# Patient Record
Sex: Male | Born: 1990 | Race: Black or African American | Hispanic: No | Marital: Single | Smoking: Current every day smoker
Health system: Southern US, Community
[De-identification: ages and names within clinical notes are randomized; demographics above are authoritative.]

## PROBLEM LIST (undated history)

## (undated) ENCOUNTER — Emergency Department (HOSPITAL_COMMUNITY): Payer: Self-pay | Source: Home / Self Care

## (undated) DIAGNOSIS — F419 Anxiety disorder, unspecified: Secondary | ICD-10-CM

## (undated) HISTORY — PX: HERNIA REPAIR: SHX51

---

## 2014-06-04 ENCOUNTER — Emergency Department (HOSPITAL_COMMUNITY)
Admission: EM | Admit: 2014-06-04 | Discharge: 2014-06-05 | Disposition: A | Discharge status: Home / Self Care | Payer: Self-pay | Attending: Emergency Medicine | Admitting: Emergency Medicine

## 2014-06-04 ENCOUNTER — Encounter (HOSPITAL_COMMUNITY): Payer: Self-pay | Admitting: Emergency Medicine

## 2014-06-04 ENCOUNTER — Emergency Department (HOSPITAL_COMMUNITY): Payer: Self-pay

## 2014-06-04 DIAGNOSIS — F419 Anxiety disorder, unspecified: Secondary | ICD-10-CM | POA: Insufficient documentation

## 2014-06-04 DIAGNOSIS — F1510 Other stimulant abuse, uncomplicated: Secondary | ICD-10-CM | POA: Insufficient documentation

## 2014-06-04 HISTORY — DX: Anxiety disorder, unspecified: F41.9

## 2014-06-04 LAB — CBC WITH DIFFERENTIAL/PLATELET
BASOS ABS: 0 10*3/uL (ref 0.0–0.1)
BASOS PCT: 0 % (ref 0–1)
EOS ABS: 0 10*3/uL (ref 0.0–0.7)
Eosinophils Relative: 0 % (ref 0–5)
HCT: 43.4 % (ref 39.0–52.0)
Hemoglobin: 14.9 g/dL (ref 13.0–17.0)
Lymphocytes Relative: 8 % — ABNORMAL LOW (ref 12–46)
Lymphs Abs: 0.9 10*3/uL (ref 0.7–4.0)
MCH: 28 pg (ref 26.0–34.0)
MCHC: 34.3 g/dL (ref 30.0–36.0)
MCV: 81.4 fL (ref 78.0–100.0)
MONOS PCT: 13 % — AB (ref 3–12)
Monocytes Absolute: 1.4 10*3/uL — ABNORMAL HIGH (ref 0.1–1.0)
NEUTROS PCT: 79 % — AB (ref 43–77)
Neutro Abs: 8.6 10*3/uL — ABNORMAL HIGH (ref 1.7–7.7)
Platelets: 314 10*3/uL (ref 150–400)
RBC: 5.33 MIL/uL (ref 4.22–5.81)
RDW: 13.2 % (ref 11.5–15.5)
WBC: 10.9 10*3/uL — ABNORMAL HIGH (ref 4.0–10.5)

## 2014-06-04 LAB — ACETAMINOPHEN LEVEL

## 2014-06-04 LAB — BASIC METABOLIC PANEL
ANION GAP: 12 (ref 5–15)
BUN: 17 mg/dL (ref 6–23)
CO2: 21 mmol/L (ref 19–32)
Calcium: 9.8 mg/dL (ref 8.4–10.5)
Chloride: 104 mmol/L (ref 96–112)
Creatinine, Ser: 1.21 mg/dL (ref 0.50–1.35)
GFR calc Af Amer: >90 mL/min (ref >90)
GFR, EST NON AFRICAN AMERICAN: 83 mL/min — AB (ref >90)
GLUCOSE: 137 mg/dL — AB (ref 70–99)
POTASSIUM: 3.7 mmol/L (ref 3.5–5.1)
SODIUM: 137 mmol/L (ref 135–145)

## 2014-06-04 LAB — RAPID URINE DRUG SCREEN, HOSP PERFORMED
AMPHETAMINES: POSITIVE — AB
BENZODIAZEPINES: NOT DETECTED
Barbiturates: NOT DETECTED
Cocaine: NOT DETECTED
Opiates: NOT DETECTED
TETRAHYDROCANNABINOL: NOT DETECTED

## 2014-06-04 LAB — SALICYLATE LEVEL

## 2014-06-04 LAB — D-DIMER, QUANTITATIVE: D-Dimer, Quant: 0.66 ug/mL-FEU — ABNORMAL HIGH (ref 0.00–0.48)

## 2014-06-04 LAB — TROPONIN I: Troponin I: 0.03 ng/mL (ref <0.031)

## 2014-06-04 LAB — TSH: TSH: 0.555 u[IU]/mL (ref 0.350–4.500)

## 2014-06-04 LAB — ETHANOL

## 2014-06-04 MED ORDER — LORAZEPAM 1 MG PO TABS
1.0000 mg | ORAL_TABLET | Freq: Once | ORAL | Status: AC
  Administered 2014-06-04: 1 mg via ORAL
  Filled 2014-06-04: qty 1

## 2014-06-04 MED ORDER — LORAZEPAM 2 MG/ML IJ SOLN
1.0000 mg | Freq: Once | INTRAMUSCULAR | Status: AC
  Administered 2014-06-04: 1 mg via INTRAVENOUS
  Filled 2014-06-04: qty 1

## 2014-06-04 MED ORDER — IOHEXOL 350 MG/ML SOLN: 100.0000 mL | Freq: Once | INTRAVENOUS | Status: AC | PRN

## 2014-06-04 NOTE — ED Notes (Signed)
Bed: WA02 Expected date:  Expected time:  Means of arrival:  Comments: HR 150 24 y/o M

## 2014-06-04 NOTE — ED Notes (Signed)
[facility]aphoretic and anxious. Ran and jumped into ambulance and stated that he had anxiety. HR. 150s ST. Would not tell EMS if he took drugs. Alert and oriented.

## 2014-06-04 NOTE — ED Notes (Signed)
Pt provided urinal. Nurse asks "were you able to go?" Pt responds 'yes' then hands nurse empty urinal.

## 2014-06-04 NOTE — ED Provider Notes (Signed)
CSN: [id number]     Arrival date & time 06/04/14  1943 History   First MD Initiated Contact with Patient 06/04/14 1948     Chief Complaint  Patient presents with  . Anxiety  . Tachycardia     (Consider location/radiation/quality/duration/timing/severity/associated sxs/prior Treatment) HPI Comments: Patient states he's having an anxiety attack. [facility] and anxious. He ran into an ambulance and stated he had anxiety. She appears to be under the influence of some substance but denies any drug or alcohol use. Says he used to take Xanax several years ago. He denies any chest pain or shortness of breath. Endorses some palpitations. He is alert and oriented 3. He denies any pain. He denies any suicidal or homicidal thoughts.  The history is provided by the patient.    Past Medical History  Diagnosis Date  . Anxiety    No past surgical history on file. No family history on file. History  Substance Use Topics  . Smoking status: Not on file  . Smokeless tobacco: Not on file  . Alcohol Use: Not on file    Review of Systems  Constitutional: Negative for fever, activity change and appetite change.  HENT: Negative for congestion, rhinorrhea and sinus pressure.   Respiratory: Negative for cough, chest tightness and shortness of breath.   Cardiovascular: Negative for chest pain.  Gastrointestinal: Negative for nausea, vomiting and abdominal pain.  Genitourinary: Negative for dysuria and hematuria.  Musculoskeletal: Negative for myalgias and arthralgias.  Skin: Negative for rash.  Neurological: Negative for dizziness, facial asymmetry, weakness and headaches.  Psychiatric/Behavioral: Positive for agitation. The patient is nervous/anxious and is hyperactive.   A complete 10 system review of systems was obtained and all systems are negative except as noted in the HPI and PMH.      Allergies  Review of patient's allergies indicates no known  allergies.  Home Medications   Prior to Admission medications   Not on File   BP 142/91 mmHg  Pulse 107  Temp(Src) 99.5 F (37.5 C) (Oral)  Resp 16  SpO2 96% Physical Exam  Constitutional: He is oriented to person, place, and time. He appears well-developed and well-nourished. No distress.  HENT:  Head: Normocephalic and atraumatic.  Mouth/Throat: Oropharynx is clear and moist. No oropharyngeal exudate.  Eyes: Conjunctivae and EOM are normal. Pupils are equal, round, and reactive to light.  Neck: Normal range of motion. Neck supple.  No meningismus.  Cardiovascular: Normal rate, normal heart sounds and intact distal pulses.   No murmur heard. tachycardic  Pulmonary/Chest: Effort normal and breath sounds normal. No respiratory distress. He has no wheezes.  Abdominal: Soft. There is no tenderness. There is no rebound and no guarding.  Musculoskeletal: Normal range of motion. He exhibits no edema or tenderness.  Neurological: He is alert and oriented to person, place, and time. No cranial nerve deficit. He exhibits normal muscle tone. Coordination normal.  No ataxia on finger to nose bilaterally. No pronator drift. 5/5 strength throughout. CN 2-12 intact. Negative Romberg. Equal grip strength. Sensation intact. Gait is normal.   Skin: Skin is warm. No rash noted.  Psychiatric: He has a normal mood and affect. His behavior is normal.  Nursing note and vitals reviewed.   ED Course  Procedures (including critical care time) Labs Review Labs Reviewed  CBC WITH DIFFERENTIAL/PLATELET - Abnormal; Notable for the following:    WBC 10.9 (*)    Neutrophils Relative % 79 (*)    Neutro  Abs 8.6 (*)    Lymphocytes Relative 8 (*)    Monocytes Relative 13 (*)    Monocytes Absolute 1.4 (*)    All other components within normal limits  BASIC METABOLIC PANEL - Abnormal; Notable for the following:    Glucose, Bld 137 (*)    GFR calc non Af Amer 83 (*)    All other components within normal  limits  D-DIMER, QUANTITATIVE - Abnormal; Notable for the following:    D-Dimer, Quant 0.66 (*)    All other components within normal limits  URINE RAPID DRUG SCREEN (HOSP PERFORMED) - Abnormal; Notable for the following:    Amphetamines POSITIVE (*)    All other components within normal limits  ACETAMINOPHEN LEVEL - Abnormal; Notable for the following:    Acetaminophen (Tylenol), Serum <10.0 (*)    All other components within normal limits  TROPONIN I  TSH  ETHANOL  SALICYLATE LEVEL    Imaging Review Dg Chest 2 View  06/04/2014   CLINICAL DATA:  Anxiety and tachycardia  EXAM: CHEST  2 VIEW  COMPARISON:  None.  FINDINGS: Normal heart size and mediastinal contours. No acute infiltrate or edema. No effusion or pneumothorax. No acute osseous findings.  IMPRESSION: Negative chest.   Electronically Signed   By: Marnee Spring M.D.   On: 06/04/2014 21:12   Ct Angio Chest Pe W/cm &/or Wo Cm  06/04/2014   CLINICAL DATA:  Patient diaphoretic and anxious. Patient with anxiety. Elevated heart rate.  EXAM: CT ANGIOGRAPHY CHEST WITH CONTRAST  TECHNIQUE: Multidetector CT imaging of the chest was performed using the standard protocol during bolus administration of intravenous contrast. Multiplanar CT image reconstructions and MIPs were obtained to evaluate the vascular anatomy.  CONTRAST:  100 cc Omnipaque 350  COMPARISON:  Chest radiograph earlier same date  FINDINGS: Examination markedly limited secondary to motion artifact.  No evidence for central or lobar pulmonary embolism. Evaluation of the more distal pulmonary arteries to include the segmental subsegmental levels nondiagnostic due to extensive motion artifact.  Mediastinum/Nodes: No enlarged axillary, mediastinal or hilar lymphadenopathy. Normal heart size. No pericardial effusion. Aorta and main pulmonary artery are normal in caliber.  Lungs/Pleura: Central airways are patent. Motion artifact limits evaluation of the pulmonary parenchyma. No large  consolidative pulmonary opacities. No pleural effusion or pneumothorax.  Upper abdomen: Visualization of the upper abdomen limited due to motion artifact. No gross abnormality identified.  Musculoskeletal: No aggressive or acute appearing osseous lesions.  Review of the MIP images confirms the above findings.  IMPRESSION: Examination markedly limited due to motion artifact.  No evidence for central, main or lobar pulmonary embolism. Evaluation of the segmental subsegmental pulmonary arteries nondiagnostic due to motion artifact.   Electronically Signed   By: Annia Belt M.D.   On: 06/04/2014 21:43     EKG Interpretation   Date/Time:  Monday June 04 2014 19:54:14 EDT Ventricular Rate:  124 PR Interval:  154 QRS Duration: 81 QT Interval:  315 QTC Calculation: 452 R Axis:   -75 Text Interpretation:  Sinus tachycardia Left axis deviation ST elev,  probable normal early repol pattern No previous ECGs available Confirmed  by Manus Gunning  MD, Zilda No [phone]) on 06/04/2014 8:00:44 PM      MDM   Final diagnoses:  None   patient presents with anxiety, bizarre behavior, diaphoresis. Denies chest pain or shortness of breath. He is tachycardic and appears anxious. Denies suicidal or homicidal thoughts.  Patient gives given by mouth Ativan. States he used  to be on Xanax but hasn't taken it in several years.  Chest x-ray is negative. Sinus tachycardia on EKG. Urinalysis with amphetamines.  Drug screen positive for amphetamines. No evidence of PE. Heart rate is improved to the 100s.  Patient now somnolent after receiving Ativan. He arouses to pain and voice and protecting his airway.  He is not awake enough for discharge at this time. Will need further monitoring of mental status.  BP 142/91 mmHg  Pulse 107  Temp(Src) 99.5 F (37.5 C) (Oral)  Resp 16  SpO2 96%      Glynn Octave, MD 06/05/14 [phone]

## 2014-06-04 NOTE — ED Notes (Signed)
Pt acting very paranoid. Keeps coming out of room and looking around. Asked to sit back in room to which he complied.

## 2014-06-05 NOTE — ED Notes (Signed)
Patient able to ambulate with steady gait. Able to tolerate fluids with no N/V.

## 2014-06-05 NOTE — ED Provider Notes (Signed)
Patient was left to change his shift after he presented with anxiety attack. He was given Ativan after which she was extremely somnolent. Patient is now awake and alert. He states he's feeling much improved. He is ambulatory in the emergency department without assistance.  Plan discharge   Devoria AlbeIva Landi Biscardi, MD, [facility]oFACEP   Jaslynn Thome, MD 06/05/14 [phone]

## 2014-06-05 NOTE — Discharge Instructions (Signed)
Chemical Dependency Stop using drugs. Follow up with your doctor. Return to the ED if you develop new or worsening symptoms. Chemical dependency is an addiction to drugs or alcohol. It is characterized by the repeated behavior of seeking out and using drugs and alcohol despite harmful consequences to the health and safety of ones self and others.  RISK FACTORS There are certain situations or behaviors that increase a person's risk for chemical dependency. These include:  A family history of chemical dependency.  A history of mental health issues, including depression and anxiety.  A home environment where drugs and alcohol are easily available to you.  Drug or alcohol use at a young age. SYMPTOMS  The following symptoms can indicate chemical dependency:  Inability to limit the use of drugs or alcohol.  Nausea, sweating, shakiness, and anxiety that occurs when alcohol or drugs are not being used.  An increase in amount of drugs or alcohol that is necessary to get drunk or high. People who experience these symptoms can assess their use of drugs and alcohol by asking themselves the following questions:  Have you been told by friends or family that they are worried about your use of alcohol or drugs?  Do friends and family ever tell you about things you did while drinking alcohol or using drugs that you do not remember?  Do you lie about using alcohol or drugs or about the amounts you use?  Do you have difficulty completing daily tasks unless you use alcohol or drugs?  Is the level of your work or school performance lower because of your drug or alcohol use?  Do you get sick from using drugs or alcohol but keep using anyway?  Do you feel uncomfortable in social situations unless you use alcohol or drugs?  Do you use drugs or alcohol to help forget problems? An answer of yes to any of these questions may indicate chemical dependency. Professional evaluation is suggested. Document  Released: 02/10/2001 Document Revised: 05/11/2011 Document Reviewed: 04/24/2010 [facility] Patient Information 2015 [place], [place]. This information is not intended to replace advice given to you by your health care provider. Make sure you discuss any questions you have with your health care provider.   Emergency Department Resource Guide 1) Find a Doctor and Pay Out of Pocket Although you won't have to find out who is covered by your insurance plan, it is a good idea to ask around and get recommendations. You will then need to call the office and see if the doctor you have chosen will accept you as a new patient and what types of options they offer for patients who are self-pay. Some doctors offer discounts or will set up payment plans for their patients who do not have insurance, but you will need to ask so you aren't surprised when you get to your appointment.  2) Contact Your Local Health Department Not all health departments have doctors that can see patients for sick visits, but many do, so it is worth a call to see if yours does. If you don't know where your local health department is, you can check in your phone book. The CDC also has a tool to help you locate your state's health department, and many state websites also have listings of all of their local health departments.  3) Find a Walk-in Clinic If your illness is not likely to be very severe or complicated, you may want to try a walk in clinic. These are popping up all over  the country in pharmacies, drugstores, and shopping centers. They're usually staffed by nurse practitioners or physician assistants that have been trained to treat common illnesses and complaints. They're usually fairly quick and inexpensive. However, if you have serious medical issues or chronic medical problems, these are probably not your best option.  No Primary Care Doctor: - Call Health Connect at  [phone] - they can help you locate a primary care doctor that   accepts your insurance, provides certain services, etc. - Physician Referral Service- [phone]  Chronic Pain Problems: Organization         Address  Phone   Notes  [facility]  [phone]) [phone] Patients need to be referred by their primary care doctor.   Medication Assistance: Organization         Address  Phone   Notes  [facility] [address] [place], [place] [address] [phone]) [phone] --Must be a resident of [place] [facility] [place] -- Must have NO insurance coverage whatsoever (no Medicaid/ Medicare, etc.) -- The pt. MUST have a primary care doctor that directs their care regularly and follows them in the community   MedAssist  [phone]   [employer]  [phone]    Agencies that provide inexpensive medical care: Organization         Address  Phone   Notes  [facility]  [phone](336) [phone]   [facility]    [phone](336) [phone]   [facility] [address] [place], [place] [address] [phone]) [phone]   [facility] [address], [place] [phone]) [phone]   [facility]anned Parenthood    [phone]) [phone]   [facility]    [phone]) [phone]   [facility] Health and [facility]  [address], [place] Phone:  [phone], Fax:  [phone] Hours of Operation:  9 am - 6 pm, M-F.  Also accepts Medicaid/Medicare and self-pay.  [facility]  [address], [place] Phone: [phone], Fax: [phone]. Hours of Operation:  8:30 am - 5:30 pm, M-F.  Also accepts Medicaid and self-pay.  [facility] [place] [address], [place] Phone: [phone]) [phone]   [facility] [address], [place], [place] [phone], Ext. [phone] Mondays & Thursdays: 7-9 AM.  First 15 patients are seen on a first come, first serve basis.    Medicaid-accepting [facility] Providers:  Organization          Address  Phone   Notes  [facility] [address], [place] [phone]) [phone] Also accepts self-pay patients.  [facility] [address] [place], [place]  [phone]) [phone]   [facility] [address], [place] [phone](336) [phone]   [facility] [address] [place] [address], [place] [phone]) [phone]   Renaye RakersVeita Bland [address], [place]   [phone] Only accepts [place] Access [place] patients after they have their name applied to their card.   Self-Pay (no insurance) in [facility] [place]:  Organization         Address  Phone   Notes  Sickle Cell Patients, [facility] [address], [place] [phone]   [facility] [address], [place] [phone]) [phone]   [facility] [place]  [address], [place] [phone]   °[facility]/Dr. Osei-Bonsu ° [address] [place] [address], [place] or [address], [place] [phone] Phone number for both [place] and [place] locations is the same.  °[facility] [address], [place] [phone]   °[facility] [address] [place] [address], [place] or [address] [phone] °[phone]   °[facility] [address], [place] [phone], phone; [phone], fax Sees patients 1st and 3rd Saturday of every month.  Must not qualify for public or private insurance (i.e. Medicaid, Medicare, [place] Health Choice, Veterans' Benefits) • Household income should be no more than 200% of the poverty level •The clinic cannot treat you if you are pregnant or think you are pregnant • Sexually transmitted diseases are not treated at the clinic.  ° ° °Dental Care: °Organization         Address  Phone  Notes  °[place] [address],  [place] [phone] Accepts children up to age 21 who are enrolled in Medicaid or [place] Health Choice; pregnant women with a Medicaid card; and children who have applied for Medicaid or [place] Health Choice, but were declined, whose parents can pay a reduced fee at time of service.  °[place] [place]  [address], [place] [phone] Accepts children up to age 21 who are enrolled in Medicaid or [place] Health Choice; pregnant women with a Medicaid card; and children who have applied for Medicaid or [place] Health Choice, but were declined, whose parents can pay a reduced fee at time of service.  °[facility] ° [address], [place] [phone] Patients are seen by appointment only. Walk-ins are not accepted. [facility] will see patients 18 years of age and older. °Monday - Tuesday (8am-5pm) °Most Wednesdays (8:30-5pm) °$30 per visit, cash only  °[facility] ° [address], [place] [phone] Patients are seen by appointment only. Walk-ins are not accepted. [facility] will see patients 18 years of age and older. °One Wednesday Evening (Monthly: Volunteer Based).  $30 per visit, cash only  °[facility]  [phone] for adults; Children under age 4, call Graduate Pediatric Dentistry at [phone]. Children aged 4-14, please call [phone] to request a pediatric application. ° Dental services are provided in all areas of dental care including fillings, crowns and bridges, complete and partial dentures, implants, gum treatment, root canals, and extractions. Preventive care is also provided. Treatment is provided to both adults and children. °Patients are selected via a lottery and there is often a waiting list. °  °[facility] [address], °[place] ° [phone] [email or web address] °  °[facility] [address], [place], [place]  [phone], Ext. [phone] Second and Fourth Thursday of each month, opens at 6:30 AM; Clinic ends at 9 AM.  Patients are seen on a first-come first-served basis, and a limited number are seen during each clinic.  ° °[facility] ° [address], [place], [place] [phone]   Eligibility Requirements °You must have lived in [place], [place], or [place] counties for at least the last three months. °  You cannot be eligible for state or federal sponsored healthcare insurance, including Veterans Administration, Medicaid, or Medicare. °  You   You generally cannot be eligible for healthcare insurance through your employer.    How to apply: Eligibility screenings are held every Tuesday and Wednesday afternoon from 1:00 pm until 4:00 pm. You do not need an appointment for the interview!  [facility] [address], [place], [place]   [place]  [place] Department  [place]  [phone]    Behavioral Health Resources in the Community: Intensive Outpatient Programs Organization         Address  Phone  Notes  [facility] Potter. [address] [place], [place] [phone]   [facility] [address] [place], [place]   ADS: [facility] [address] [place], [place]   [place] [address]  [place], [place] or [phone]   Substance Abuse Resources Organization         Address  Phone  Notes  [facility]  [phone]   [place]  [phone]   [facility] [place]   Chinita Pester  [phone])[phone]   Residential & Outpatient Substance Abuse Program  [phone]   Psychological Services Organization         Address  Phone  Notes  [facility]  [place]  [phone]    [place] [address], [place] or [phone]    Mobile Crisis Teams Organization         Address  Phone  Notes  [facility], Mobile Crisis Care Unit  [phone]   [facility]  [address] [place], [place]   [place] Levels [address], [place] [phone]    Self-Help/Support Groups Organization         Address  Phone             Notes  [place] [place] - variety of support groups  [place] Call for more information  Narcotics Anonymous (NA), [facility] [address], [employer] [place]  2 meetings at this location   Special educational needs teacher         Address  Phone  Notes  [facility] [place],    [place]  [phone]   [facility]  [address], [place] [address]19, [place], [place]   [place], [place] [phone] Admissions: 8am-3pm M-F  Incentives Substance [place] [address],    [place], [place] [phone]   [facility] [address] [place], [place], [place]   [facility] [address],  [place], [place]   [facility] - Intensive Outpatient [address], [place], [place]   [facility].,  [place], [place] [phone] or [phone]   [facility] (RTS) [address], [place], [place] Accepts Medicaid  [facility] [place] [address],  [place] [phone] Substance Abuse/Addiction Treatment   [facility] Organization         Address  Phone  Notes  [facility]  [phone]   Domenic Schwab, PhD [address] [place], [place]   [phone] or [phone]   [facility]   [phone]  [address] [place], [place] [phone](336) [phone]   [facility] [address], [place], [place] [phone]) [phone] Insurance/Medicaid/sponsorship through [facility]  [facility] [address]                                    [place], [place] [phone]) [phone] Therapy/tele-psych/case  [facility] [address]   [place], [place] [phone](336) [phone]    Dr. Lolly MustacheArfeen  [phone](336) [phone]   [facility]  [employer] [facility] 1) [address], [place] 2) [address], [place] 3)  [address], [place] [phone]) [phone]) [phone]  [phone]) [phone]   [facility] Child Abuse Hotline [phone](336) [phone] or [phone]) [phone] (After Hours)

## 2014-06-09 DIAGNOSIS — Z720 Tobacco use: Secondary | ICD-10-CM | POA: Insufficient documentation

## 2014-06-09 DIAGNOSIS — Z8659 Personal history of other mental and behavioral disorders: Secondary | ICD-10-CM | POA: Insufficient documentation

## 2014-06-09 DIAGNOSIS — L02413 Cutaneous abscess of right upper limb: Secondary | ICD-10-CM | POA: Insufficient documentation

## 2014-06-10 ENCOUNTER — Emergency Department (HOSPITAL_COMMUNITY)
Admission: EM | Admit: 2014-06-10 | Discharge: 2014-06-10 | Disposition: A | Discharge status: Home / Self Care | Payer: Self-pay | Attending: Emergency Medicine | Admitting: Emergency Medicine

## 2014-06-10 ENCOUNTER — Encounter (HOSPITAL_COMMUNITY): Payer: Self-pay

## 2014-06-10 DIAGNOSIS — L02413 Cutaneous abscess of right upper limb: Secondary | ICD-10-CM

## 2014-06-10 LAB — CBC WITH DIFFERENTIAL/PLATELET
Basophils Absolute: 0.1 10*3/uL (ref 0.0–0.1)
Basophils Relative: 1 % (ref 0–1)
Eosinophils Absolute: 0.1 10*3/uL (ref 0.0–0.7)
Eosinophils Relative: 2 % (ref 0–5)
HCT: 42.6 % (ref 39.0–52.0)
Hemoglobin: 14.2 g/dL (ref 13.0–17.0)
LYMPHS ABS: 1.5 10*3/uL (ref 0.7–4.0)
Lymphocytes Relative: 27 % (ref 12–46)
MCH: 27.4 pg (ref 26.0–34.0)
MCHC: 33.3 g/dL (ref 30.0–36.0)
MCV: 82.2 fL (ref 78.0–100.0)
Monocytes Absolute: 1 10*3/uL (ref 0.1–1.0)
Monocytes Relative: 18 % — ABNORMAL HIGH (ref 3–12)
Neutro Abs: 2.8 10*3/uL (ref 1.7–7.7)
Neutrophils Relative %: 52 % (ref 43–77)
PLATELETS: 350 10*3/uL (ref 150–400)
RBC: 5.18 MIL/uL (ref 4.22–5.81)
RDW: 13 % (ref 11.5–15.5)
WBC: 5.4 10*3/uL (ref 4.0–10.5)

## 2014-06-10 LAB — BASIC METABOLIC PANEL
ANION GAP: 13 (ref 5–15)
BUN: 11 mg/dL (ref 6–23)
CO2: 26 mmol/L (ref 19–32)
Calcium: 9.7 mg/dL (ref 8.4–10.5)
Chloride: 101 mmol/L (ref 96–112)
Creatinine, Ser: 1.01 mg/dL (ref 0.50–1.35)
Glucose, Bld: 96 mg/dL (ref 70–99)
POTASSIUM: 3.6 mmol/L (ref 3.5–5.1)
Sodium: 140 mmol/L (ref 135–145)

## 2014-06-10 MED ORDER — CLINDAMYCIN HCL 150 MG PO CAPS: 300.0000 mg | ORAL_CAPSULE | Freq: Three times a day (TID) | ORAL | Status: AC

## 2014-06-10 MED ORDER — CLINDAMYCIN PHOSPHATE 900 MG/50ML IV SOLN
900.0000 mg | Freq: Once | INTRAVENOUS | Status: AC
  Administered 2014-06-10: 900 mg via INTRAVENOUS
  Filled 2014-06-10: qty 50

## 2014-06-10 MED ORDER — HYDROGEN PEROXIDE 3 % EX SOLN
CUTANEOUS | Status: AC
  Filled 2014-06-10: qty 473

## 2014-06-10 MED ORDER — IBUPROFEN 800 MG PO TABS: 800.0000 mg | ORAL_TABLET | Freq: Three times a day (TID) | ORAL | Status: AC

## 2014-06-10 MED ORDER — LIDOCAINE HCL (PF) 1 % IJ SOLN
5.0000 mL | Freq: Once | INTRAMUSCULAR | Status: AC
  Administered 2014-06-10: 5 mL
  Filled 2014-06-10: qty 5

## 2014-06-10 MED ORDER — OXYCODONE-ACETAMINOPHEN 5-325 MG PO TABS: 1.0000 | ORAL_TABLET | ORAL | Status: AC | PRN

## 2014-06-10 MED ORDER — OXYCODONE-ACETAMINOPHEN 5-325 MG PO TABS
1.0000 | ORAL_TABLET | Freq: Once | ORAL | Status: AC
  Administered 2014-06-10: 1 via ORAL
  Filled 2014-06-10: qty 1

## 2014-06-10 NOTE — ED Provider Notes (Signed)
CSN: [id number]     Arrival date & time 06/09/14  2356 History   First MD Initiated Contact with Patient 06/10/14 0013     Chief Complaint  Patient presents with  . Abscess     (Consider location/radiation/quality/duration/timing/severity/associated sxs/prior Treatment) Patient is a 24 y.o. male presenting with abscess. The history is provided by the patient. No language interpreter was used.  Abscess Location:  Shoulder/arm Shoulder/arm abscess location:  R elbow Abscess quality: painful and redness   Abscess quality: not draining   Duration:  3 days Associated symptoms: no fever, no nausea and no vomiting   Associated symptoms comment:  Large abscess in the right arm, worsening over 3 days. No fever but he reports hot/cold chills. No vomiting. The patient denies IV drug abuse.    Past Medical History  Diagnosis Date  . Anxiety    History reviewed. No pertinent past surgical history. History reviewed. No pertinent family history. History  Substance Use Topics  . Smoking status: Current Every Day Smoker -- 0.50 packs/day    Types: Cigarettes  . Smokeless tobacco: Not on file  . Alcohol Use: Yes     Comment: social    Review of Systems  Constitutional: Positive for chills. Negative for fever.  Gastrointestinal: Negative.  Negative for nausea and vomiting.  Musculoskeletal: Negative.  Negative for myalgias.  Skin: Positive for color change.       Large abscess right arm  Neurological: Negative.       Allergies  Review of patient's allergies indicates no known allergies.  Home Medications   Prior to Admission medications   Medication Sig Start Date End Date Taking? Authorizing Provider  acetaminophen (TYLENOL) 325 MG tablet Take 650 mg by mouth every 6 (six) hours as needed for moderate pain.   Yes Historical Provider, MD  tetrahydrozoline 0.05 % ophthalmic solution Place 1 drop into both eyes 2 (two) times daily as needed (dry eyes).   Yes Historical Provider, MD    BP 139/87 mmHg  Pulse 100  Temp(Src) 98.6 F (37 C) (Oral)  Resp 20  Ht 6\' 4"  (1.93 m)  Wt 200 lb (90.719 kg)  BMI 24.35 kg/m2  SpO2 100% Physical Exam  Constitutional: He is oriented to person, place, and time. He appears well-developed and well-nourished. No distress.  Musculoskeletal: Normal range of motion. He exhibits tenderness.  Neurological: He is alert and oriented to person, place, and time.  Skin:  Large area of swelling with fluctuant center to volar distal upper arm just above AC space. No active drainage. There is erythema extending proximally without streaking. No evidence of multiple injection sites or 'track marks'.      ED Course  Procedures (including critical care time) Labs Review Labs Reviewed  CBC WITH DIFFERENTIAL/PLATELET - Abnormal; Notable for the following:    Monocytes Relative 18 (*)    All other components within normal limits  BASIC METABOLIC PANEL    Imaging Review No results found.   EKG Interpretation None     INCISION AND DRAINAGE Performed by: Elpidio AnisUPSTILL, Ezekial Arns A Consent: Verbal consent obtained. Risks and benefits: risks, benefits and alternatives were discussed Type: abscess  Body area: right upper arm  Anesthesia: local infiltration  Incision was made with a scalpel.  Local anesthetic: lidocaine 2% w/o epinephrine  Anesthetic total: 2 ml  Complexity: complex Blunt dissection to break up loculations  Drainage: purulent  Drainage amount: large, purulent  Packing material: 1/4 in iodoform gauze  Patient tolerance: Patient tolerated the  procedure well with no immediate complications.    MDM   Final diagnoses:  None    1. Cutaneous abscess  Patient given IV clindamycin given large area of cellulitis surrounding the abscess. VSS. Pain medication provided. Instruction provided for care and 2-day recheck in ED.     Elpidio Anis, PA-C 06/10/14 1027  Devoria Albe, MD 06/10/14 [phone]

## 2014-06-10 NOTE — Discharge Instructions (Signed)

## 2014-06-10 NOTE — ED Notes (Signed)
Patient reports he began having pain on right arm three days ago and area has gradually become more inflamed and painful.

## 2014-06-15 ENCOUNTER — Encounter (HOSPITAL_COMMUNITY): Payer: Self-pay | Admitting: Emergency Medicine

## 2014-06-15 ENCOUNTER — Emergency Department (HOSPITAL_COMMUNITY)
Admission: EM | Admit: 2014-06-15 | Discharge: 2014-06-15 | Disposition: A | Discharge status: Home / Self Care | Payer: Self-pay | Attending: Emergency Medicine | Admitting: Emergency Medicine

## 2014-06-15 DIAGNOSIS — Z8659 Personal history of other mental and behavioral disorders: Secondary | ICD-10-CM | POA: Insufficient documentation

## 2014-06-15 DIAGNOSIS — R197 Diarrhea, unspecified: Secondary | ICD-10-CM | POA: Insufficient documentation

## 2014-06-15 DIAGNOSIS — Z4801 Encounter for change or removal of surgical wound dressing: Secondary | ICD-10-CM | POA: Insufficient documentation

## 2014-06-15 DIAGNOSIS — Z792 Long term (current) use of antibiotics: Secondary | ICD-10-CM | POA: Insufficient documentation

## 2014-06-15 DIAGNOSIS — Z5189 Encounter for other specified aftercare: Secondary | ICD-10-CM

## 2014-06-15 DIAGNOSIS — Z720 Tobacco use: Secondary | ICD-10-CM | POA: Insufficient documentation

## 2014-06-15 NOTE — Discharge Instructions (Signed)
Return if your diarrhea worsens  Scar Minimization You will have a scar anytime you have surgery and a cut is made in the skin or you have something removed from your skin (mole, skin cancer, cyst). Although scars are unavoidable following surgery, there are ways to minimize their appearance. It is important to follow all the instructions you receive from your caregiver about wound care. How your wound heals will influence the appearance of your scar. If you do not follow the wound care instructions as directed, complications such as infection may occur. Wound instructions include keeping the wound clean, moist, and not letting the wound form a scab. Some people form scars that are raised and lumpy (hypertrophic) or larger than the initial wound (keloidal). HOME CARE INSTRUCTIONS   Follow wound care instructions as directed.  Keep the wound clean by washing it with soap and water.  Keep the wound moist with provided antibiotic cream or petroleum jelly until completely healed. Moisten twice a day for about 2 weeks.  Get stitches (sutures) taken out at the scheduled time.  Avoid touching or manipulating your wound unless needed. Wash your hands thoroughly before and after touching your wound.  Follow all restrictions such as limits on exercise or work. This depends on where your scar is located.  Keep the scar protected from sunburn. Cover the scar with sunscreen/sunblock with SPF 30 or higher.  Gently massage the scar using a circular motion to help minimize the appearance of the scar. Do this only after the wound has closed and all the sutures have been removed.  For hypertrophic or keloidal scars, there are several ways to treat and minimize their appearance. Methods include compression therapy, intralesional corticosteroids, laser therapy, or surgery. These methods are performed by your caregiver. Remember that the scar may appear lighter or darker than your normal skin color. This  difference in color should even out with time. SEEK MEDICAL CARE IF:   You have a fever.  You develop signs of infection such as pain, redness, pus, and warmth.  You have questions or concerns. Document Released: 08/06/2009 Document Revised: 05/11/2011 Document Reviewed: 08/06/2009 [facility] Patient Information 2015 [place], [place]. This information is not intended to replace advice given to you by your health care provider. Make sure you discuss any questions you have with your health care provider.  Wound Check Your wound appears healthy today. Your wound will heal gradually over time. Eventually a scar will form that will fade with time. FACTORS THAT AFFECT SCAR FORMATION:  People differ in the severity in which they scar.  Scar severity varies according to location, size, and the traits you inherited from your parents (genetic predisposition).  Irritation to the wound from infection, rubbing, or chemical exposure will increase the amount of scar formation. HOME CARE INSTRUCTIONS   If you were given a dressing, you should change it at least once a day or as instructed by your caregiver. If the bandage sticks, soak it off with a solution of hydrogen peroxide.  If the bandage becomes wet, dirty, or develops a bad smell, change it as soon as possible.  Look for signs of infection.  Only take over-the-counter or prescription medicines for pain, discomfort, or fever as directed by your caregiver. SEEK IMMEDIATE MEDICAL CARE IF:   You have redness, swelling, or increasing pain in the wound.  You notice pus coming from the wound.  You have a fever.  You notice a bad smell coming from the wound or dressing. Document Released:  11/23/2003 Document Revised: 05/11/2011 Document Reviewed: 02/16/2005 [employer] Patient Information 2015 [employer]. This information is not intended to replace advice given to you by your health care provider. Make sure you discuss any questions you have  with your health care provider.

## 2014-06-15 NOTE — ED Notes (Signed)
Pt requesting  Wound follow up/recheck of I and D site in r/anticubital. C/o tenderness at site. No swelling or redness noted. Pt is noncompliant with discharge instructions. Pt stated that he did not fill any of his prescriptions for financial reasons

## 2014-06-15 NOTE — ED Provider Notes (Signed)
CSN: [id number]     Arrival date & time 06/15/14  1128 History   First MD Initiated Contact with Patient 06/15/14 1139     Chief Complaint  Patient presents with  . Wound Check    post I and D of abscess in r/arm     (Consider location/radiation/quality/duration/timing/severity/associated sxs/prior Treatment) HPI  This is a 24 year old male who presents to the emergency department for wound check.  Patient had incision and drainage performed on 06/10/2014. Patient had a dose of IV clindamycin. However, he did not fill his oral clindamycin due to cost. The patient states that he has seen significant improvement in the wound. He denies any heat, redness, drainage. There is minimal tenderness to palpation. Patient also states that he has had several episodes of watery diarrhea after receiving IV clindamycin. He denies excessive or voluminous stools, severe nausea, and severe foul odor of stools. Past Medical History  Diagnosis Date  . Anxiety    Past Surgical History  Procedure Laterality Date  . Hernia repair     Family History  Problem Relation Age of Onset  . Diabetes Mother    History  Substance Use Topics  . Smoking status: Current Every Day Smoker -- 0.50 packs/day    Types: Cigarettes  . Smokeless tobacco: Not on file  . Alcohol Use: Yes     Comment: social    Review of Systems  Ten systems reviewed and are negative for acute change, except as noted in the HPI.    Allergies  Review of patient's allergies indicates no known allergies.  Home Medications   Prior to Admission medications   Medication Sig Start Date End Date Taking? Authorizing Provider  acetaminophen (TYLENOL) 325 MG tablet Take 650 mg by mouth every 6 (six) hours as needed for moderate pain.    Historical Provider, MD  clindamycin (CLEOCIN) 150 MG capsule Take 2 capsules (300 mg total) by mouth 3 (three) times daily. 06/10/14   Elpidio Anis, PA-C  ibuprofen (ADVIL,MOTRIN) 800 MG tablet Take 1 tablet  (800 mg total) by mouth 3 (three) times daily. 06/10/14   Elpidio Anis, PA-C  oxyCODONE-acetaminophen (PERCOCET/ROXICET) 5-325 MG per tablet Take 1-2 tablets by mouth every 4 (four) hours as needed for severe pain. 06/10/14   Elpidio Anis, PA-C  tetrahydrozoline 0.05 % ophthalmic solution Place 1 drop into both eyes 2 (two) times daily as needed (dry eyes).    Historical Provider, MD   BP 117/69 mmHg  Pulse 73  Temp(Src) 98.6 F (37 C) (Oral)  Resp 18  SpO2 98% Physical Exam  Constitutional: He appears well-developed and well-nourished. No distress.  HENT:  Head: Normocephalic and atraumatic.  Eyes: Conjunctivae are normal. No scleral icterus.  Neck: Normal range of motion. Neck supple.  Cardiovascular: Normal rate, regular rhythm and normal heart sounds.   Pulmonary/Chest: Effort normal and breath sounds normal. No respiratory distress.  Abdominal: Soft. There is no tenderness.  Musculoskeletal: He exhibits no edema.  Well healing wound on the right antecubital space, no discharge, no induration, no surrounding redness heat or swelling.  Neurological: He is alert.  Skin: Skin is warm and dry. He is not diaphoretic.  Psychiatric: His behavior is normal.  Nursing note and vitals reviewed.   ED Course  Procedures (including critical care time) Labs Review Labs Reviewed - No data to display  Imaging Review No results found.   EKG Interpretation None      MDM   Final diagnoses:  Wound check, abscess  Diarrhea    Wound appears to be healing well. Vision is unable to provide a stool sample at this time. I've asked that he return if his symptoms do not resolve or worsen.    Arthor Captainbigail Kip Kautzman, PA-C 06/15/14 1448  Glynn OctaveStephen Rancour, MD 06/15/14 [id number]

## 2015-06-01 DEATH — deceased

## 2017-02-20 IMAGING — CT CT ANGIO CHEST
1 of 3 series · 18 of 33 positions shown · IV contrast (OMNIPAQUE 350)
Comparison: Chest radiograph earlier same date

CLINICAL DATA: Patient diaphoretic and anxious. Patient with
anxiety. Elevated heart rate.

EXAM:
CT ANGIOGRAPHY CHEST WITH CONTRAST
TECHNIQUE: Multidetector CT imaging of the chest was performed using the
standard protocol during bolus administration of intravenous
contrast. Multiplanar CT image reconstructions and MIPs were
obtained to evaluate the vascular anatomy.
CONTRAST:  100 cc Omnipaque 350

[Series 6: thins for pacs · axial · 0.74mm/px · z∈[-242,+1]mm · 18 of 279 slices shown]
[im 18/279  lung]
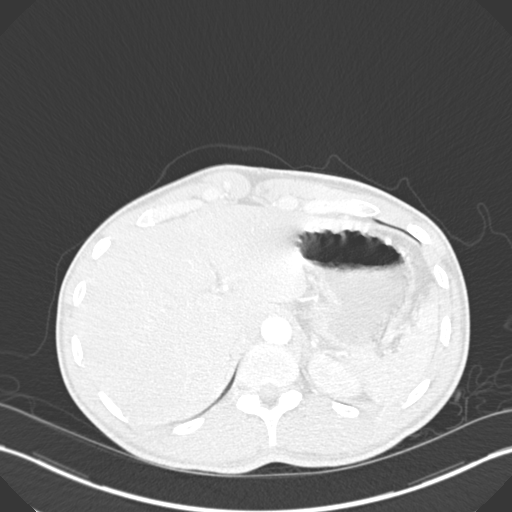
[im 35/279  mediastinal]
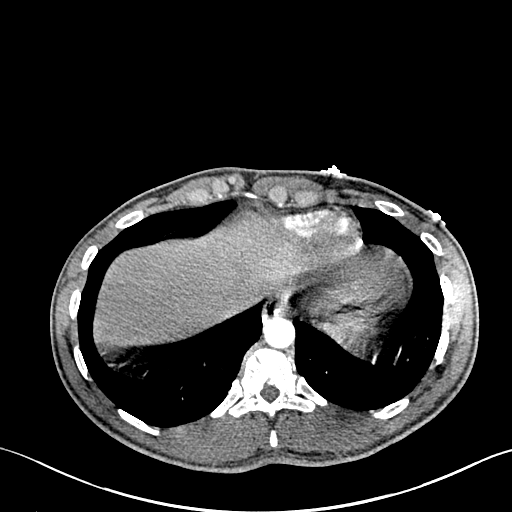
[im 53/279  lung]
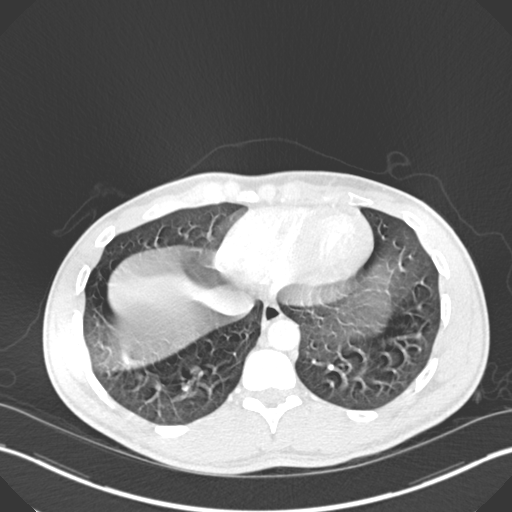
[im 70/279  mediastinal]
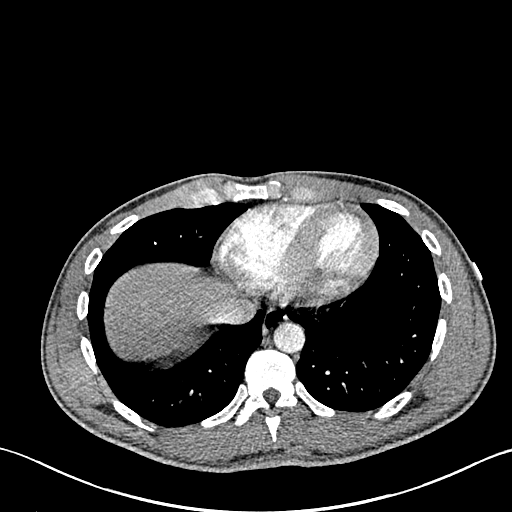
[im 87/279  lung]
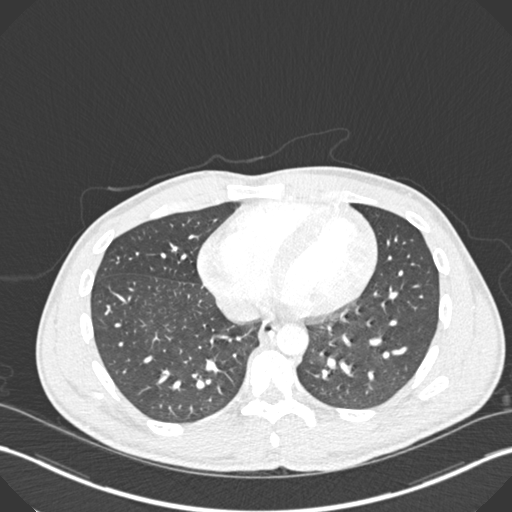
[im 93/279  mediastinal]
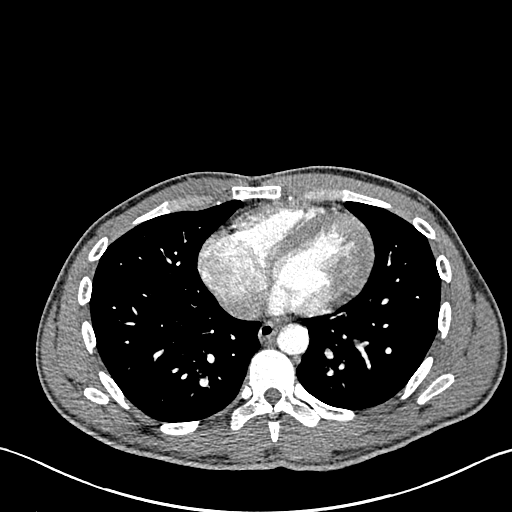
[im 105/279  lung]
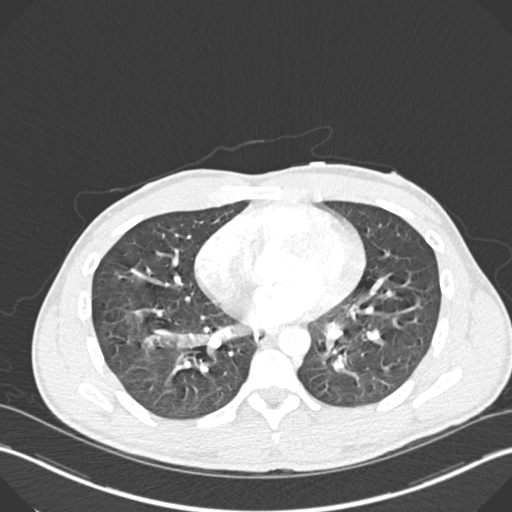
[im 122/279  mediastinal]
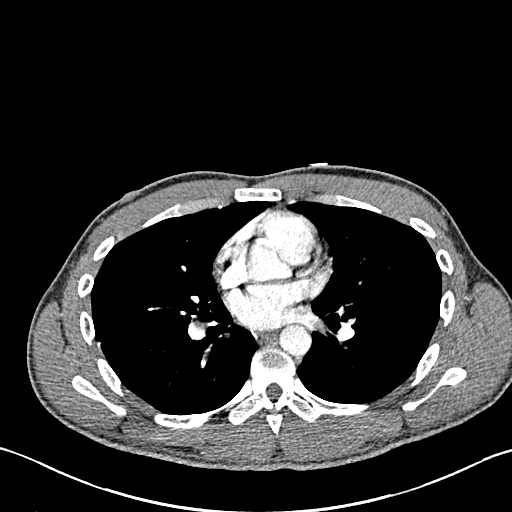
[im 130/279  lung]
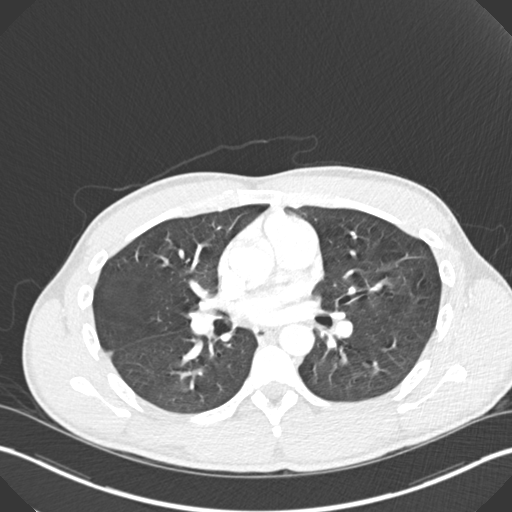
[im 140/279  mediastinal]
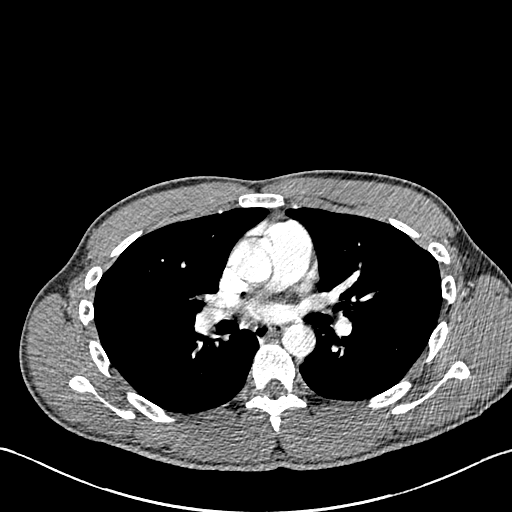
[im 157/279  lung]
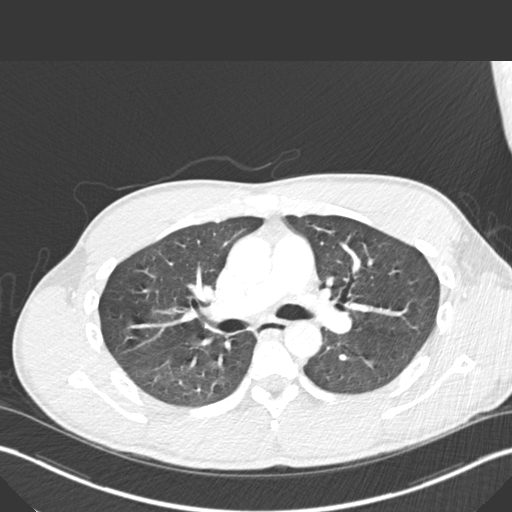
[im 174/279  mediastinal]
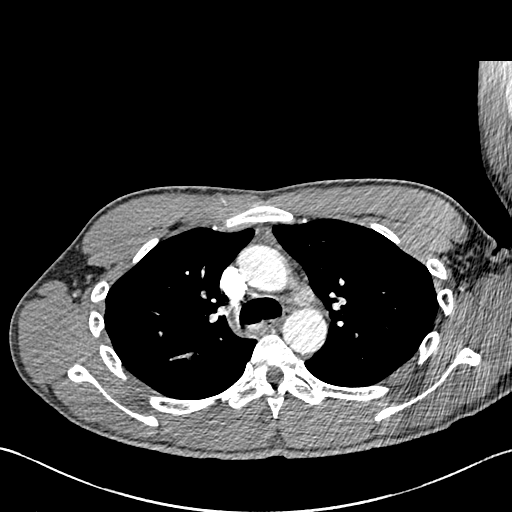
[im 186/279  lung]
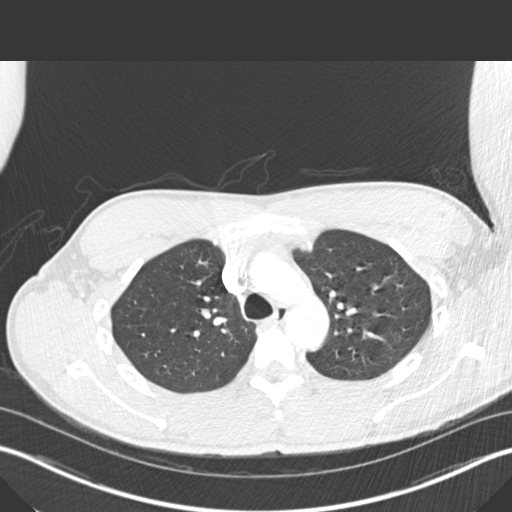
[im 192/279  mediastinal]
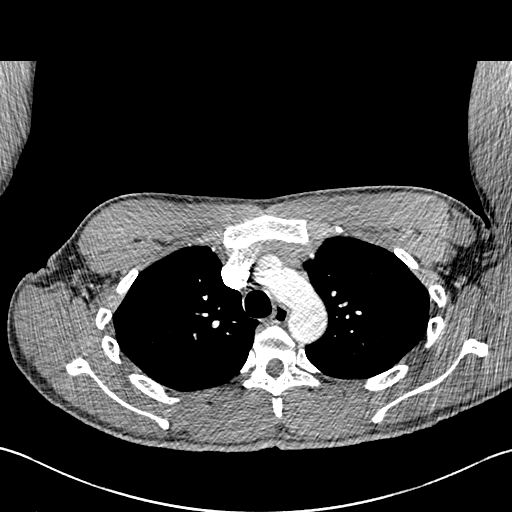
[im 209/279  lung]
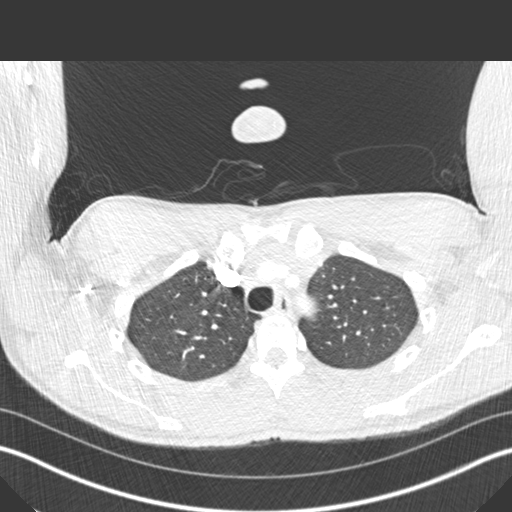
[im 226/279  mediastinal]
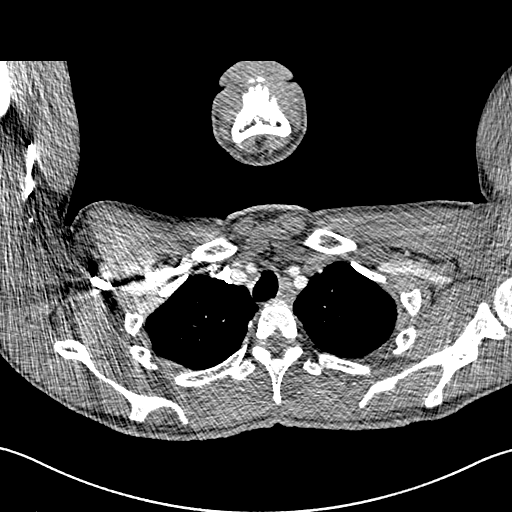
[im 244/279  lung]
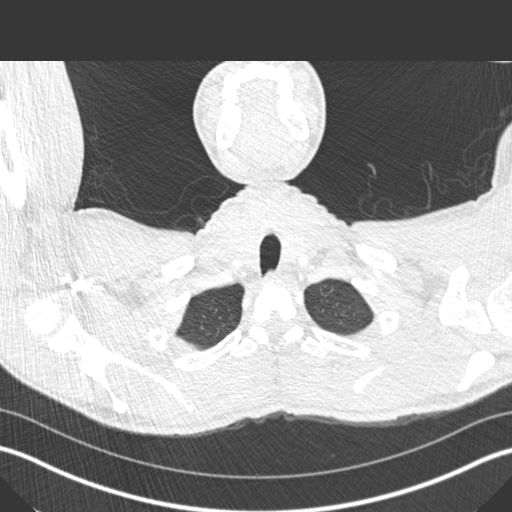
[im 261/279  mediastinal]
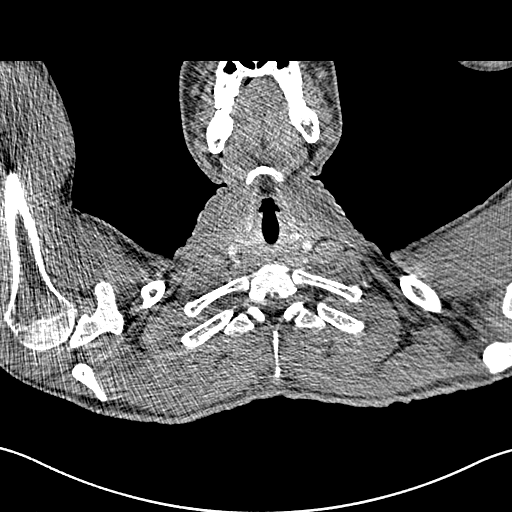

[18 of 33 positions shown; findings below may reference images not displayed]

FINDINGS: Examination markedly limited secondary to motion artifact.

No evidence for central or lobar pulmonary embolism. Evaluation of
the more distal pulmonary arteries to include the segmental
subsegmental levels nondiagnostic due to extensive motion artifact.

Mediastinum/Nodes: No enlarged axillary, mediastinal or hilar
lymphadenopathy. Normal heart size. No pericardial effusion. Aorta
and main pulmonary artery are normal in caliber.

Lungs/Pleura: Central airways are patent. Motion artifact limits
evaluation of the pulmonary parenchyma. No large consolidative
pulmonary opacities. No pleural effusion or pneumothorax.

Upper abdomen: Visualization of the upper abdomen limited due to
motion artifact. No gross abnormality identified.

Musculoskeletal: No aggressive or acute appearing osseous lesions.

Review of the MIP images confirms the above findings.
IMPRESSION: Examination markedly limited due to motion artifact.

No evidence for central, main or lobar pulmonary embolism.
Evaluation of the segmental subsegmental pulmonary arteries
nondiagnostic due to motion artifact.
